# Patient Record
Sex: Male | Born: 1995 | Race: White | Hispanic: No | Marital: Single | Smoking: Current every day smoker
Health system: Southern US, Community
[De-identification: ages and names within clinical notes are randomized; demographics above are authoritative.]

## PROBLEM LIST (undated history)

## (undated) DIAGNOSIS — F909 Attention-deficit hyperactivity disorder, unspecified type: Secondary | ICD-10-CM

---
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
## 2015-06-28 ENCOUNTER — Emergency Department: Payer: No Typology Code available for payment source

## 2015-06-28 ENCOUNTER — Encounter: Payer: Self-pay | Admitting: General Practice

## 2015-06-28 ENCOUNTER — Emergency Department
Admission: EM | Admit: 2015-06-28 | Discharge: 2015-06-28 | Disposition: A | Discharge status: Home / Self Care | Payer: No Typology Code available for payment source | Attending: Emergency Medicine | Admitting: Emergency Medicine

## 2015-06-28 DIAGNOSIS — Y92410 Unspecified street and highway as the place of occurrence of the external cause: Secondary | ICD-10-CM | POA: Diagnosis not present

## 2015-06-28 DIAGNOSIS — S39012A Strain of muscle, fascia and tendon of lower back, initial encounter: Secondary | ICD-10-CM | POA: Diagnosis not present

## 2015-06-28 DIAGNOSIS — S0990XA Unspecified injury of head, initial encounter: Secondary | ICD-10-CM | POA: Diagnosis not present

## 2015-06-28 DIAGNOSIS — S4992XA Unspecified injury of left shoulder and upper arm, initial encounter: Secondary | ICD-10-CM | POA: Diagnosis present

## 2015-06-28 DIAGNOSIS — Z720 Tobacco use: Secondary | ICD-10-CM | POA: Diagnosis not present

## 2015-06-28 DIAGNOSIS — S80211A Abrasion, right knee, initial encounter: Secondary | ICD-10-CM | POA: Insufficient documentation

## 2015-06-28 DIAGNOSIS — T148XXA Other injury of unspecified body region, initial encounter: Secondary | ICD-10-CM

## 2015-06-28 DIAGNOSIS — Y998 Other external cause status: Secondary | ICD-10-CM | POA: Insufficient documentation

## 2015-06-28 DIAGNOSIS — S40022A Contusion of left upper arm, initial encounter: Secondary | ICD-10-CM

## 2015-06-28 DIAGNOSIS — S29019A Strain of muscle and tendon of unspecified wall of thorax, initial encounter: Secondary | ICD-10-CM

## 2015-06-28 DIAGNOSIS — S59902A Unspecified injury of left elbow, initial encounter: Secondary | ICD-10-CM | POA: Diagnosis not present

## 2015-06-28 DIAGNOSIS — Y9389 Activity, other specified: Secondary | ICD-10-CM | POA: Insufficient documentation

## 2015-06-28 DIAGNOSIS — S29012A Strain of muscle and tendon of back wall of thorax, initial encounter: Secondary | ICD-10-CM | POA: Insufficient documentation

## 2015-06-28 MED ORDER — IBUPROFEN 800 MG PO TABS: 800.0000 mg | ORAL_TABLET | Freq: Three times a day (TID) | ORAL | Status: DC | PRN

## 2015-06-28 MED ORDER — TRAMADOL HCL 50 MG PO TABS: 50.0000 mg | ORAL_TABLET | Freq: Three times a day (TID) | ORAL | Status: DC | PRN
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
## 2015-06-28 NOTE — ED Notes (Signed)
D/c instructions reviewed w/ pt - pt denies any further questions or concerns at present.  Pt instructed to not use alcohol, drive, or operate heavy machinery while take the prescription pain medications as they could make him drowsy - pt verbalized understanding.   
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
## 2016-04-07 ENCOUNTER — Encounter: Payer: Self-pay | Admitting: Emergency Medicine

## 2016-04-07 ENCOUNTER — Emergency Department
Admission: EM | Admit: 2016-04-07 | Discharge: 2016-04-07 | Disposition: A | Discharge status: Home / Self Care | Payer: Self-pay | Attending: Emergency Medicine | Admitting: Emergency Medicine

## 2016-04-07 ENCOUNTER — Emergency Department: Payer: Self-pay

## 2016-04-07 DIAGNOSIS — S6291XA Unspecified fracture of right wrist and hand, initial encounter for closed fracture: Secondary | ICD-10-CM

## 2016-04-07 DIAGNOSIS — Z791 Long term (current) use of non-steroidal anti-inflammatories (NSAID): Secondary | ICD-10-CM | POA: Insufficient documentation

## 2016-04-07 DIAGNOSIS — Y999 Unspecified external cause status: Secondary | ICD-10-CM | POA: Insufficient documentation

## 2016-04-07 DIAGNOSIS — S62141A Displaced fracture of body of hamate [unciform] bone, right wrist, initial encounter for closed fracture: Secondary | ICD-10-CM | POA: Insufficient documentation

## 2016-04-07 DIAGNOSIS — F909 Attention-deficit hyperactivity disorder, unspecified type: Secondary | ICD-10-CM | POA: Insufficient documentation

## 2016-04-07 DIAGNOSIS — Y929 Unspecified place or not applicable: Secondary | ICD-10-CM | POA: Insufficient documentation

## 2016-04-07 DIAGNOSIS — S62306A Unspecified fracture of fifth metacarpal bone, right hand, initial encounter for closed fracture: Secondary | ICD-10-CM | POA: Insufficient documentation

## 2016-04-07 DIAGNOSIS — F17210 Nicotine dependence, cigarettes, uncomplicated: Secondary | ICD-10-CM | POA: Insufficient documentation

## 2016-04-07 DIAGNOSIS — W2209XA Striking against other stationary object, initial encounter: Secondary | ICD-10-CM | POA: Insufficient documentation

## 2016-04-07 DIAGNOSIS — Y939 Activity, unspecified: Secondary | ICD-10-CM | POA: Insufficient documentation

## 2016-04-07 DIAGNOSIS — S62304A Unspecified fracture of fourth metacarpal bone, right hand, initial encounter for closed fracture: Secondary | ICD-10-CM | POA: Insufficient documentation

## 2016-04-07 HISTORY — DX: Attention-deficit hyperactivity disorder, unspecified type: F90.9

## 2016-04-07 MED ORDER — IBUPROFEN 800 MG PO TABS
800.0000 mg | ORAL_TABLET | Freq: Once | ORAL | Status: AC
  Administered 2016-04-07: 800 mg via ORAL
  Filled 2016-04-07: qty 1

## 2016-04-07 MED ORDER — OXYCODONE-ACETAMINOPHEN 5-325 MG PO TABS
1.0000 | ORAL_TABLET | Freq: Once | ORAL | Status: AC
  Administered 2016-04-07: 1 via ORAL
  Filled 2016-04-07: qty 1

## 2016-04-07 MED ORDER — IBUPROFEN 800 MG PO TABS: 800.0000 mg | ORAL_TABLET | Freq: Three times a day (TID) | ORAL | Status: DC | PRN

## 2016-04-07 MED ORDER — OXYCODONE-ACETAMINOPHEN 7.5-325 MG PO TABS: 1.0000 | ORAL_TABLET | ORAL | Status: DC | PRN
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
## 2017-08-02 ENCOUNTER — Emergency Department
Admission: EM | Admit: 2017-08-02 | Discharge: 2017-08-02 | Disposition: A | Discharge status: Home / Self Care | Payer: Self-pay | Attending: Emergency Medicine | Admitting: Emergency Medicine

## 2017-08-02 ENCOUNTER — Emergency Department: Payer: Self-pay

## 2017-08-02 ENCOUNTER — Encounter: Payer: Self-pay | Admitting: Emergency Medicine

## 2017-08-02 DIAGNOSIS — M549 Dorsalgia, unspecified: Secondary | ICD-10-CM | POA: Insufficient documentation

## 2017-08-02 DIAGNOSIS — S0990XS Unspecified injury of head, sequela: Secondary | ICD-10-CM

## 2017-08-02 DIAGNOSIS — H9042 Sensorineural hearing loss, unilateral, left ear, with unrestricted hearing on the contralateral side: Secondary | ICD-10-CM | POA: Insufficient documentation

## 2017-08-02 DIAGNOSIS — H9192 Unspecified hearing loss, left ear: Secondary | ICD-10-CM
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
## 2017-08-02 NOTE — ED Notes (Signed)
NAD noted at time of D/C. Pt denies questions or concerns. Pt ambulatory to the lobby at this time.  
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
## 2017-08-02 NOTE — Discharge Instructions (Signed)
Call and schedule appointment with the ear, nose, and throat clinic tomorrow morning.

## 2017-08-02 NOTE — ED Notes (Signed)
Patient placed in c-collar by Toribio HarbourMike Childress.
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
## 2018-09-16 ENCOUNTER — Emergency Department
Admission: EM | Admit: 2018-09-16 | Discharge: 2018-09-16 | Disposition: A | Discharge status: Home / Self Care | Payer: Self-pay | Attending: Emergency Medicine | Admitting: Emergency Medicine

## 2018-09-16 ENCOUNTER — Encounter: Payer: Self-pay | Admitting: Emergency Medicine

## 2018-09-16 ENCOUNTER — Emergency Department: Payer: Self-pay

## 2018-09-16 DIAGNOSIS — F17210 Nicotine dependence, cigarettes, uncomplicated: Secondary | ICD-10-CM | POA: Insufficient documentation

## 2018-09-16 DIAGNOSIS — Y929 Unspecified place or not applicable: Secondary | ICD-10-CM | POA: Insufficient documentation

## 2018-09-16 DIAGNOSIS — S62336A Displaced fracture of neck of fifth metacarpal bone, right hand, initial encounter for closed fracture: Secondary | ICD-10-CM | POA: Insufficient documentation

## 2018-09-16 DIAGNOSIS — W2209XA Striking against other stationary object, initial encounter: Secondary | ICD-10-CM | POA: Insufficient documentation

## 2018-09-16 DIAGNOSIS — Y939 Activity, unspecified: Secondary | ICD-10-CM | POA: Insufficient documentation

## 2018-09-16 DIAGNOSIS — Y999 Unspecified external cause status: Secondary | ICD-10-CM | POA: Insufficient documentation

## 2018-09-16 DIAGNOSIS — S62339A Displaced fracture of neck of unspecified metacarpal bone, initial encounter for closed fracture: Secondary | ICD-10-CM

## 2018-09-16 MED ORDER — IBUPROFEN 600 MG PO TABS: 600.0000 mg | ORAL_TABLET | Freq: Three times a day (TID) | ORAL | 0 refills | Status: DC | PRN

## 2018-09-16 MED ORDER — TRAMADOL HCL 50 MG PO TABS: 50.0000 mg | ORAL_TABLET | Freq: Two times a day (BID) | ORAL | 0 refills | Status: DC | PRN
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
## 2018-09-16 NOTE — Discharge Instructions (Addendum)
Wear splint and follow discharge care instruction.  Take medication as directed.  Follow-up on call for the department in 2 days for further evaluation and treatment.
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
## 2020-01-14 ENCOUNTER — Ambulatory Visit: Payer: HRSA Program | Attending: Internal Medicine

## 2020-01-14 DIAGNOSIS — Z20822 Contact with and (suspected) exposure to covid-19: Secondary | ICD-10-CM | POA: Insufficient documentation

## 2020-01-15 LAB — NOVEL CORONAVIRUS, NAA: SARS-CoV-2, NAA: NOT DETECTED

## 2020-05-16 DIAGNOSIS — Y999 Unspecified external cause status: Secondary | ICD-10-CM | POA: Insufficient documentation

## 2020-05-16 DIAGNOSIS — W2209XA Striking against other stationary object, initial encounter: Secondary | ICD-10-CM | POA: Insufficient documentation

## 2020-05-16 DIAGNOSIS — S50811A Abrasion of right forearm, initial encounter: Secondary | ICD-10-CM | POA: Insufficient documentation

## 2020-05-16 DIAGNOSIS — S60221A Contusion of right hand, initial encounter: Secondary | ICD-10-CM | POA: Insufficient documentation

## 2020-05-16 DIAGNOSIS — Y929 Unspecified place or not applicable: Secondary | ICD-10-CM | POA: Insufficient documentation

## 2020-05-16 DIAGNOSIS — F17210 Nicotine dependence, cigarettes, uncomplicated: Secondary | ICD-10-CM | POA: Insufficient documentation

## 2020-05-16 DIAGNOSIS — F909 Attention-deficit hyperactivity disorder, unspecified type: Secondary | ICD-10-CM | POA: Insufficient documentation

## 2020-05-16 DIAGNOSIS — Z79899 Other long term (current) drug therapy: Secondary | ICD-10-CM | POA: Insufficient documentation

## 2020-05-16 DIAGNOSIS — Y939 Activity, unspecified: Secondary | ICD-10-CM | POA: Insufficient documentation

## 2020-05-17 ENCOUNTER — Emergency Department
Admission: EM | Admit: 2020-05-17 | Discharge: 2020-05-17 | Disposition: A | Discharge status: Home / Self Care | Payer: Self-pay | Attending: Emergency Medicine | Admitting: Emergency Medicine

## 2020-05-17 ENCOUNTER — Emergency Department: Payer: Self-pay

## 2020-05-17 ENCOUNTER — Encounter: Payer: Self-pay | Admitting: Emergency Medicine

## 2020-05-17 ENCOUNTER — Other Ambulatory Visit: Payer: Self-pay

## 2020-05-17 DIAGNOSIS — S60221A Contusion of right hand, initial encounter: Secondary | ICD-10-CM

## 2020-05-17 DIAGNOSIS — T07XXXA Unspecified multiple injuries, initial encounter: Secondary | ICD-10-CM

## 2020-05-17 MED ORDER — IBUPROFEN 600 MG PO TABS: 600.0000 mg | ORAL_TABLET | Freq: Once | ORAL | Status: DC
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
## 2020-05-17 NOTE — Discharge Instructions (Signed)
You may take Tylenol and/or ibuprofen as needed for pain.  Return to the ER for worsening symptoms, persistent vomiting, difficulty breathing or other concerns.
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
## 2020-07-09 ENCOUNTER — Emergency Department: Payer: Self-pay

## 2020-07-09 ENCOUNTER — Emergency Department
Admission: EM | Admit: 2020-07-09 | Discharge: 2020-07-09 | Disposition: A | Discharge status: Home / Self Care | Payer: Self-pay | Attending: Emergency Medicine | Admitting: Emergency Medicine

## 2020-07-09 ENCOUNTER — Other Ambulatory Visit: Payer: Self-pay

## 2020-07-09 ENCOUNTER — Encounter: Payer: Self-pay | Admitting: Emergency Medicine

## 2020-07-09 DIAGNOSIS — R590 Localized enlarged lymph nodes: Secondary | ICD-10-CM | POA: Insufficient documentation

## 2020-07-09 DIAGNOSIS — F909 Attention-deficit hyperactivity disorder, unspecified type: Secondary | ICD-10-CM | POA: Insufficient documentation

## 2020-07-09 DIAGNOSIS — F17210 Nicotine dependence, cigarettes, uncomplicated: Secondary | ICD-10-CM | POA: Insufficient documentation

## 2020-07-09 DIAGNOSIS — M79605 Pain in left leg: Secondary | ICD-10-CM

## 2020-07-09 MED ORDER — LIDOCAINE 5 % EX PTCH
1.0000 | MEDICATED_PATCH | CUTANEOUS | Status: DC
  Administered 2020-07-09: 1 via TRANSDERMAL
  Filled 2020-07-09: qty 1

## 2020-07-09 MED ORDER — NAPROXEN 500 MG PO TABS
500.0000 mg | ORAL_TABLET | Freq: Once | ORAL | Status: AC
  Administered 2020-07-09: 500 mg via ORAL
  Filled 2020-07-09: qty 1

## 2020-07-09 MED ORDER — NAPROXEN 500 MG PO TABS: 500.0000 mg | ORAL_TABLET | Freq: Two times a day (BID) | ORAL | Status: AC

## 2020-07-09 NOTE — ED Provider Notes (Signed)
[facility] Emergency Department Provider Note   ____________________________________________   First MD Initiated Contact with Patient 07/09/20 1323     (approximate)  I have reviewed the triage vital signs and the nursing notes.   HISTORY  Chief Complaint Groin Pain   HPI Jeremy Hopkins is a 24 y.o. male Patient presents with left groin pain for 5 days.  Patient the pain has increased in the last 24 hours.  No provocative incident for complaint.  Patient state  a couple of "hard  knots" in the area of concern.  Patient rates pain as 7/10.  Described pain as "achy".  No palliative measure for complaint.  Patient denies dysuria or fever.           Past Medical History:  Diagnosis Date  . ADHD (attention deficit hyperactivity disorder)     There are no problems to display for this patient.   History reviewed. No pertinent surgical history.  Prior to Admission medications   Medication Sig Start Date End Date Taking? Authorizing Provider  naproxen (NAPROSYN) 500 MG tablet Take 1 tablet (500 mg total) by mouth 2 (two) times daily with a meal. 07/09/20   Joni Reining, PA-C    Allergies Bee venom  No family history on file.  Social History Social History   Tobacco Use  . Smoking status: Current Every Day Smoker    Types: Cigarettes  . Smokeless tobacco: Current User  Substance Use Topics  . Alcohol use: Yes  . Drug use: Yes    Types: Marijuana    Review of Systems Constitutional: No fever/chills Eyes: No visual changes. ENT: No sore throat. Cardiovascular: Denies chest pain. Respiratory: Denies shortness of breath. Gastrointestinal: No abdominal pain.  No nausea, no vomiting.  No diarrhea.  No constipation. Genitourinary: Negative for dysuria. Musculoskeletal: Negative for back pain. Skin: Negative for rash. Neurological: Negative for headaches, focal weakness or numbness. Psychiatric:  ADHD. Allergic/Immunilogical: Bee  venom.  ____________________________________________   PHYSICAL EXAM:  VITAL SIGNS: ED Triage Vitals  Enc Vitals Group     BP 07/09/20 1318 125/78     Pulse Rate 07/09/20 1318 78     Resp 07/09/20 1318 18     Temp 07/09/20 1318 98 F (36.7 C)     Temp Source 07/09/20 1318 Oral     SpO2 07/09/20 1318 99 %     Weight 07/09/20 1314 165 lb (74.8 kg)     Height 07/09/20 1314 5\' 8"  (1.727 m)     Head Circumference --      Peak Flow --      Pain Score 07/09/20 1314 7     Pain Loc --      Pain Edu? --      Excl. in GC? --    Constitutional: Alert and oriented. Well appearing and in no acute distress. Hematological/Lymphatic/Immunilogical: Left inguinal lymphadenopathy. Cardiovascular: Normal rate, regular rhythm. Grossly normal heart sounds.  Good peripheral circulation. Respiratory: Normal respiratory effort.  No retractions. Lungs CTAB. Gastrointestinal: Soft and nontender. No distention. No abdominal bruits. No CVA tenderness. Musculoskeletal: No lower extremity tenderness nor edema.  No joint effusions. Neurologic:  Normal speech and language. No gross focal neurologic deficits are appreciated. No gait instability. Skin:  Skin is warm, dry and intact. No rash noted. Psychiatric: Mood and affect are normal. Speech and behavior are normal.  ____________________________________________   LABS (all labs ordered are listed, but only abnormal results are displayed)  Labs Reviewed -  No data to display ____________________________________________  EKG   ____________________________________________  RADIOLOGY  ED MD interpretation:    Official radiology report(s): [place] LT LOWER EXTREM LTD SOFT TISSUE NON VASCULAR  Result Date: 07/09/2020 CLINICAL DATA:  Left groin pain 5 days. EXAM: ULTRASOUND left LOWER EXTREMITY LIMITED TECHNIQUE: Ultrasound examination of the lower extremity soft tissues was performed in the area of clinical concern. COMPARISON:  None. FINDINGS: Scanning  in the left groin which is the area of clinical concern. There are cluster of lymph nodes in left groin measuring under 1 cm. These have normal fatty hilum and show mild hypervascularity on Doppler. No pathologic nodes.  No cystic or solid mass. IMPRESSION: Cluster of reactive lymph nodes in left groin under 1 cm. These are not pathologically enlarged. Electronically Signed   By: Marlan Palau M.D.   On: 07/09/2020 15:01    ____________________________________________   PROCEDURES  Procedure(s) performed (including Critical Care):  Procedures   ____________________________________________   INITIAL IMPRESSION / ASSESSMENT AND PLAN / ED COURSE  As part of my medical decision making, I reviewed the following data within the electronic MEDICAL RECORD NUMBER     Patient complain of swelling in the left inguinal area for about 5 days.  Patient denies provocative incident for complaint.  Discussed ultrasound findings with patient.  Patient complaining physical exam is consistent with left inguinal adenopathy.  Patient given discharge care instruction advised take medication as directed.  Advised to [facility]   [name] was evaluated in Emergency Department on 07/09/2020 for the symptoms described in the history of present illness. He was evaluated in the context of the global COVID-19 pandemic, which necessitated consideration that the patient might be at risk for infection with the SARS-CoV-2 virus that causes COVID-19. Institutional protocols and algorithms that pertain to the evaluation of patients at risk for COVID-19 are in a state of rapid change based on information released by regulatory bodies including the CDC and federal and state organizations. These policies and algorithms were followed during the patient's care in the ED.       ____________________________________________   FINAL CLINICAL IMPRESSION(S) / ED DIAGNOSES  Final diagnoses:    Inguinal adenopathy     ED Discharge Orders         Ordered    naproxen (NAPROSYN) 500 MG tablet  2 times daily with meals     Discontinue  Reprint     07/09/20 1511           Note:  This document was prepared using Dragon voice recognition software and may include unintentional dictation errors.    Joni Reining, PA-C 07/09/20 1517    Jene Every, MD 07/09/20 [phone]

## 2020-07-09 NOTE — Discharge Instructions (Addendum)
Follow discharge care instructions and take medication as directed. 

## 2020-07-09 NOTE — ED Triage Notes (Signed)
Presents with pain to left groin/hip area  States he developed pain about 5 days ago   Pain has gotten progressively worse  Ambulates with limp  Denies any injury   But has couple of "hard knot"areas in groin

## 2020-07-15 ENCOUNTER — Telehealth: Payer: Self-pay | Admitting: General Practice

## 2020-07-15 NOTE — Telephone Encounter (Signed)
ED referral- left message 

## 2020-11-22 ENCOUNTER — Emergency Department: Payer: Self-pay

## 2020-11-22 ENCOUNTER — Emergency Department
Admission: EM | Admit: 2020-11-22 | Discharge: 2020-11-23 | Disposition: A | Discharge status: Home / Self Care | Payer: Self-pay | Attending: Emergency Medicine | Admitting: Emergency Medicine

## 2020-11-22 ENCOUNTER — Encounter: Payer: Self-pay | Admitting: Emergency Medicine

## 2020-11-22 DIAGNOSIS — Z008 Encounter for other general examination: Secondary | ICD-10-CM

## 2020-11-22 DIAGNOSIS — S0990XA Unspecified injury of head, initial encounter: Secondary | ICD-10-CM

## 2020-11-22 DIAGNOSIS — S0101XA Laceration without foreign body of scalp, initial encounter: Secondary | ICD-10-CM | POA: Insufficient documentation

## 2020-11-22 DIAGNOSIS — F17210 Nicotine dependence, cigarettes, uncomplicated: Secondary | ICD-10-CM | POA: Insufficient documentation

## 2020-11-22 DIAGNOSIS — W228XXA Striking against or struck by other objects, initial encounter: Secondary | ICD-10-CM | POA: Insufficient documentation
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
## 2020-11-23 NOTE — Discharge Instructions (Addendum)
Return to the ER for worsening symptoms, persistent vomiting, lethargy or other concerns. °
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
